# Patient Record
Sex: Male | Born: 1960 | Race: White | Hispanic: No | Smoking: Current every day smoker
Health system: Southern US, Community
[De-identification: ages and names within clinical notes are randomized; demographics above are authoritative.]

## PROBLEM LIST (undated history)

## (undated) DIAGNOSIS — I10 Essential (primary) hypertension: Secondary | ICD-10-CM

---

## 2016-04-21 ENCOUNTER — Encounter (HOSPITAL_COMMUNITY): Payer: Self-pay | Admitting: Emergency Medicine

## 2016-04-21 ENCOUNTER — Emergency Department (HOSPITAL_COMMUNITY): Payer: Managed Care, Other (non HMO)

## 2016-04-21 ENCOUNTER — Emergency Department (HOSPITAL_COMMUNITY)
Admission: EM | Admit: 2016-04-21 | Discharge: 2016-04-21 | Disposition: A | Discharge status: Home / Self Care | Payer: Managed Care, Other (non HMO) | Attending: Emergency Medicine | Admitting: Emergency Medicine

## 2016-04-21 DIAGNOSIS — Z79899 Other long term (current) drug therapy: Secondary | ICD-10-CM | POA: Diagnosis not present

## 2016-04-21 DIAGNOSIS — R05 Cough: Secondary | ICD-10-CM | POA: Diagnosis present

## 2016-04-21 DIAGNOSIS — F17200 Nicotine dependence, unspecified, uncomplicated: Secondary | ICD-10-CM | POA: Insufficient documentation

## 2016-04-21 DIAGNOSIS — J40 Bronchitis, not specified as acute or chronic: Secondary | ICD-10-CM | POA: Insufficient documentation

## 2016-04-21 LAB — CBC WITH DIFFERENTIAL/PLATELET
BASOS ABS: 0.1 10*3/uL (ref 0.0–0.1)
BASOS PCT: 1 %
EOS ABS: 0.2 10*3/uL (ref 0.0–0.7)
EOS PCT: 2 %
HCT: 46.4 % (ref 39.0–52.0)
Hemoglobin: 16.2 g/dL (ref 13.0–17.0)
Lymphocytes Relative: 30 %
Lymphs Abs: 3 10*3/uL (ref 0.7–4.0)
MCH: 31.6 pg (ref 26.0–34.0)
MCHC: 34.9 g/dL (ref 30.0–36.0)
MCV: 90.4 fL (ref 78.0–100.0)
MONO ABS: 0.6 10*3/uL (ref 0.1–1.0)
Monocytes Relative: 6 %
NEUTROS ABS: 6.2 10*3/uL (ref 1.7–7.7)
Neutrophils Relative %: 61 %
PLATELETS: 250 10*3/uL (ref 150–400)
RBC: 5.13 MIL/uL (ref 4.22–5.81)
RDW: 12.2 % (ref 11.5–15.5)
WBC: 9.9 10*3/uL (ref 4.0–10.5)

## 2016-04-21 LAB — BASIC METABOLIC PANEL
Anion gap: 10 (ref 5–15)
BUN: 8 mg/dL (ref 6–20)
CALCIUM: 8.7 mg/dL — AB (ref 8.9–10.3)
CO2: 25 mmol/L (ref 22–32)
Chloride: 100 mmol/L — ABNORMAL LOW (ref 101–111)
Creatinine, Ser: 0.87 mg/dL (ref 0.61–1.24)
Glucose, Bld: 129 mg/dL — ABNORMAL HIGH (ref 65–99)
Potassium: 4 mmol/L (ref 3.5–5.1)
SODIUM: 135 mmol/L (ref 135–145)

## 2016-04-21 MED ORDER — IPRATROPIUM-ALBUTEROL 0.5-2.5 (3) MG/3ML IN SOLN
3.0000 mL | Freq: Once | RESPIRATORY_TRACT | Status: AC
  Administered 2016-04-21: 3 mL via RESPIRATORY_TRACT
  Filled 2016-04-21: qty 3

## 2016-04-21 MED ORDER — PREDNISONE 10 MG (21) PO TBPK: ORAL_TABLET | ORAL | 0 refills | Status: AC

## 2016-04-21 MED ORDER — ALBUTEROL SULFATE HFA 108 (90 BASE) MCG/ACT IN AERS
1.0000 | INHALATION_SPRAY | RESPIRATORY_TRACT | Status: DC | PRN
  Administered 2016-04-21: 1 via RESPIRATORY_TRACT
  Filled 2016-04-21: qty 6.7

## 2016-04-21 NOTE — ED Notes (Signed)
Bed: WA06 Expected date: 04/21/16 Expected time: 8:48 AM Means of arrival: Ambulance Comments: 56 yo M wheezing, SHOB

## 2016-04-21 NOTE — Discharge Instructions (Signed)
Follow-up with your primary care doctor next week to make sure the wheezing has resolved.  As we discussed, consider seeing an ENT doctor if you continue to have hoarseness.

## 2016-04-21 NOTE — ED Triage Notes (Signed)
Patient here from home with complaints of SOB upon awakening this am. Reports cough and nasal congestion for 2 weeks. Albuterol and solu medrol 125mg  given. Denies fever. No home meds.

## 2016-04-21 NOTE — ED Provider Notes (Signed)
[facility]-EMERGENCY DEPT Provider Note   CSN: [id number] Arrival date & time: 04/21/16  0857     History   Chief Complaint Chief Complaint  Patient presents with  . Cough  . Shortness of Breath    HPI Christopher Woodard is a 56 y.o. male.  HPI Pt has had a cough off and on for several months.  Pt is a smoker.   He had some nasal congestion. Pt was treating it with OTC meds.  This morning when he woke up though he felt more short of breath.  His throat feels dry.  He could barely catch his breath.  He called EMS.  He was treated with breathing treatments and steroids and feels much bretter.     History reviewed. No pertinent past medical history.  There are no active problems to display for this patient.   History reviewed. No pertinent surgical history.     Home Medications    Prior to Admission medications   Medication Sig Start Date End Date Taking? Authorizing Provider  ibuprofen (ADVIL,MOTRIN) 200 MG tablet Take 600 mg by mouth every 6 (six) hours as needed for fever or moderate pain.   Yes Historical Provider, MD  predniSONE (STERAPRED UNI-PAK 21 TAB) 10 MG (21) TBPK tablet Take 6 tabs by mouth daily  for 2 days, then 5 tabs for 2 days, then 4 tabs for 2 days, then 3 tabs for 2 days, 2 tabs for 2 days, then 1 tab by mouth daily for 2 days 04/21/16   Linwood Dibbles, MD    Family History No family history on file.  Social History Social History  Substance Use Topics  . Smoking status: Current Every Day Smoker  . Smokeless tobacco: Never Used  . Alcohol use Yes     Allergies   Patient has no allergy information on record.   Review of Systems Review of Systems  All other systems reviewed and are negative.    Physical Exam Updated Vital Signs BP 146/77   Pulse 88   Temp 98.2 F (36.8 C) (Oral)   Resp 13   SpO2 97%   Physical Exam  Constitutional: He appears well-developed and well-nourished. No distress.  HENT:  Head: Normocephalic and atraumatic.  Right  Ear: External ear normal.  Left Ear: External ear normal.  Hoarseness, no stridor  Eyes: Conjunctivae are normal. Right eye exhibits no discharge. Left eye exhibits no discharge. No scleral icterus.  Neck: Neck supple. No tracheal deviation present.  Cardiovascular: Normal rate, regular rhythm and intact distal pulses.   Pulmonary/Chest: Effort normal. No stridor. No respiratory distress. He has wheezes. He has no rales.  Abdominal: Soft. Bowel sounds are normal. He exhibits no distension. There is no tenderness. There is no rebound and no guarding.  Musculoskeletal: He exhibits no edema or tenderness.  Neurological: He is alert. He has normal strength. No cranial nerve deficit (no facial droop, extraocular movements intact, no slurred speech) or sensory deficit. He exhibits normal muscle tone. He displays no seizure activity. Coordination normal.  Skin: Skin is warm and dry. No rash noted.  Psychiatric: He has a normal mood and affect.  Nursing note and vitals reviewed.    ED Treatments / Results  Labs (all labs ordered are listed, but only abnormal results are displayed) Labs Reviewed  BASIC METABOLIC PANEL - Abnormal; Notable for the following:       Result Value   Chloride 100 (*)    Glucose, Bld 129 (*)  Calcium 8.7 (*)    All other components within normal limits  CBC WITH DIFFERENTIAL/PLATELET    EKG  EKG Interpretation  Date/Time:  Saturday April 21 2016 09:03:37 EST Ventricular Rate:  93 PR Interval:    QRS Duration: 96 QT Interval:  352 QTC Calculation: 438 R Axis:   84 Text Interpretation:  Sinus rhythm Minimal ST depression, inferior leads No old tracing to compare Confirmed by Joshiah Traynham  MD-J, Audrea Bolte [phone]([id number]) on 04/21/2016 9:10:33 AM       Radiology Dg Chest 2 View  Result Date: 04/21/2016 CLINICAL DATA:  Shortness of breath. EXAM: CHEST  2 VIEW COMPARISON:  None. FINDINGS: The heart size is borderline. The hila and mediastinum are normal. No nodules or masses.  Mild interstitial prominence without overt edema. No focal infiltrate identified. IMPRESSION: No acute abnormalities identified. Electronically Signed   By: Gerome Samavid  Branan III M.D   On: 04/21/2016 09:41    Procedures Procedures (including critical care time)  Medications Ordered in ED Medications  albuterol (PROVENTIL HFA;VENTOLIN HFA) 108 (90 Base) MCG/ACT inhaler 1-2 puff (not administered)  ipratropium-albuterol (DUONEB) 0.5-2.5 (3) MG/3ML nebulizer solution 3 mL (3 mLs Nebulization Given 04/21/16 1016)     Initial Impression / Assessment and Plan / ED Course  I have reviewed the triage vital signs and the nursing notes.  Pertinent labs & imaging results that were available during my care of the patient were reviewed by me and considered in my medical decision making (see chart for details).   the patient's symptoms improved with treatment in the emergency room. He was breathing more easily. Chest x-ray does not show pneumonia.  I suspect his symptoms are related to bronchospasm associated with his smoking. Patient however does have a laryngitis component. He is not having stridor but there sounds like there is some inflammation of the vocal cords. Because of his smoking history stressed the importance of making sure this resolves. If his symptoms persist after the steroid treatment he should see an ENT doctor. Patient does not live in this area so he will follow up with his primary doctor when he returns home.  Final Clinical Impressions(s) / ED Diagnoses   Final diagnoses:  Bronchitis    New Prescriptions New Prescriptions   PREDNISONE (STERAPRED UNI-PAK 21 TAB) 10 MG (21) TBPK TABLET    Take 6 tabs by mouth daily  for 2 days, then 5 tabs for 2 days, then 4 tabs for 2 days, then 3 tabs for 2 days, 2 tabs for 2 days, then 1 tab by mouth daily for 2 days     Linwood DibblesJon Shyvonne Chastang, MD 04/21/16 1159

## 2016-05-18 ENCOUNTER — Encounter (HOSPITAL_COMMUNITY): Payer: Self-pay | Admitting: Emergency Medicine

## 2016-05-18 ENCOUNTER — Emergency Department (HOSPITAL_COMMUNITY)
Admission: EM | Admit: 2016-05-18 | Discharge: 2016-05-19 | Disposition: A | Discharge status: Home / Self Care | Payer: Managed Care, Other (non HMO) | Attending: Emergency Medicine | Admitting: Emergency Medicine

## 2016-05-18 ENCOUNTER — Emergency Department (HOSPITAL_COMMUNITY): Payer: Managed Care, Other (non HMO)

## 2016-05-18 DIAGNOSIS — R221 Localized swelling, mass and lump, neck: Secondary | ICD-10-CM | POA: Insufficient documentation

## 2016-05-18 DIAGNOSIS — R0602 Shortness of breath: Secondary | ICD-10-CM | POA: Diagnosis present

## 2016-05-18 DIAGNOSIS — J387 Other diseases of larynx: Secondary | ICD-10-CM

## 2016-05-18 HISTORY — DX: Essential (primary) hypertension: I10

## 2016-05-18 MED ORDER — SODIUM CHLORIDE 3 % IN NEBU
4.0000 mL | INHALATION_SOLUTION | Freq: Once | RESPIRATORY_TRACT | Status: AC
  Administered 2016-05-19: 4 mL via RESPIRATORY_TRACT
  Filled 2016-05-18: qty 4

## 2016-05-18 MED ORDER — ALBUTEROL SULFATE HFA 108 (90 BASE) MCG/ACT IN AERS
6.0000 | INHALATION_SPRAY | Freq: Once | RESPIRATORY_TRACT | Status: AC
  Administered 2016-05-18: 6 via RESPIRATORY_TRACT
  Filled 2016-05-18: qty 6.7

## 2016-05-18 MED ORDER — AEROCHAMBER PLUS W/MASK MISC
1.0000 | Freq: Once | Status: AC
  Administered 2016-05-19: 1
  Filled 2016-05-18: qty 1

## 2016-05-18 NOTE — ED Provider Notes (Signed)
[facility]-EMERGENCY DEPT Provider Note   CSN: [id number] Arrival date & time: 05/18/16  2309     History   Chief Complaint Chief Complaint  Patient presents with  . Respiratory Distress    HPI Christopher Woodard is a 56 y.o. male.  56 yo M with a chief complaint of shortness of breath. This been going on for the past 3 weeks. Patient says he has trouble getting air in. Feels that his breathing in from a pinched straw. Denies fevers or chills. Has some ongoing coughing. Was giving a breathing treatment en route with EMS with some improvement. Has seen his family physician for the same started him on multiple COPD medications. Patient feels that he is not gotten any better. Denies any lower extremity edema. Denies prior history of PE or DVT.   The history is provided by the patient and a relative.  Illness  This is a new problem. The current episode started more than 1 week ago. The problem occurs constantly. The problem has not changed since onset.Associated symptoms include shortness of breath. Pertinent negatives include no chest pain, no abdominal pain and no headaches. Nothing aggravates the symptoms. Nothing relieves the symptoms. He has tried nothing for the symptoms. The treatment provided no relief.    Past Medical History:  Diagnosis Date  . Hypertension     There are no active problems to display for this patient.   History reviewed. No pertinent surgical history.     Home Medications    Prior to Admission medications   Medication Sig Start Date End Date Taking? Authorizing Provider  albuterol (PROVENTIL HFA;VENTOLIN HFA) 108 (90 Base) MCG/ACT inhaler Inhale 1-2 puffs into the lungs every 6 (six) hours as needed for wheezing or shortness of breath.   Yes Historical Provider, MD  amLODipine (NORVASC) 5 MG tablet Take 5 mg by mouth daily.   Yes Historical Provider, MD  cetirizine (ZYRTEC) 10 MG tablet Take 10 mg by mouth daily.   Yes Historical Provider, MD  fluticasone  (FLONASE) 50 MCG/ACT nasal spray Place 1 spray into both nostrils daily.   Yes Historical Provider, MD  ibuprofen (ADVIL,MOTRIN) 200 MG tablet Take 600 mg by mouth every 6 (six) hours as needed for moderate pain.   Yes Historical Provider, MD  montelukast (SINGULAIR) 10 MG tablet Take 10 mg by mouth daily.   Yes Historical Provider, MD  umeclidinium-vilanterol (ANORO ELLIPTA) 62.5-25 MCG/INH AEPB Inhale 1 puff into the lungs daily.   Yes Historical Provider, MD  predniSONE (STERAPRED UNI-PAK 21 TAB) 10 MG (21) TBPK tablet Take 6 tabs by mouth daily  for 2 days, then 5 tabs for 2 days, then 4 tabs for 2 days, then 3 tabs for 2 days, 2 tabs for 2 days, then 1 tab by mouth daily for 2 days Patient not taking: Reported on 05/19/2016 04/21/16   Linwood Dibbles, MD    Family History No family history on file.  Social History Social History  Substance Use Topics  . Smoking status: Current Every Day Smoker  . Smokeless tobacco: Never Used  . Alcohol use Yes     Allergies   Patient has no known allergies.   Review of Systems Review of Systems  Constitutional: Negative for chills and fever.  HENT: Positive for congestion. Negative for facial swelling.   Eyes: Negative for discharge and visual disturbance.  Respiratory: Positive for cough and shortness of breath.   Cardiovascular: Negative for chest pain and palpitations.  Gastrointestinal: Negative for abdominal pain,  diarrhea and vomiting.  Musculoskeletal: Negative for arthralgias and myalgias.  Skin: Negative for color change and rash.  Neurological: Negative for tremors, syncope and headaches.  Psychiatric/Behavioral: Negative for confusion and dysphoric mood.     Physical Exam Updated Vital Signs BP 181/94 Comment: MD aware of BP and states pt is ok for dc  Pulse 89 Comment: MD aware of BP and states pt is ok for dc  Temp 98.3 F (36.8 C) (Oral)   Resp 20   Ht 6' (1.829 m)   Wt 202 lb (91.6 kg)   SpO2 94% Comment: MD aware of BP and  states pt is ok for dc  BMI 27.40 kg/m   Physical Exam  Constitutional: He is oriented to person, place, and time. He appears well-developed and well-nourished.  HENT:  Head: Normocephalic and atraumatic.  Inspiratory stridor. Tolerating his secretions. No noted posterior pharyngeal swelling. Posterior nasal drip is noted. No fullness to the submandibular space. Uvula is midline.  Bilateral bulging TMs with purulent effusions.  No sinus tenderness to percussion.  Eyes: EOM are normal. Pupils are equal, round, and reactive to light.  Neck: Normal range of motion. Neck supple. No JVD present.  Cardiovascular: Normal rate and regular rhythm.  Exam reveals no gallop and no friction rub.   No murmur heard. Pulmonary/Chest: No respiratory distress. He has no wheezes.  Abdominal: He exhibits no distension and no mass. There is no tenderness. There is no rebound and no guarding.  Musculoskeletal: Normal range of motion.  Neurological: He is alert and oriented to person, place, and time.  Skin: No rash noted. No pallor.  Psychiatric: He has a normal mood and affect. His behavior is normal.  Nursing note and vitals reviewed.    ED Treatments / Results  Labs (all labs ordered are listed, but only abnormal results are displayed) Labs Reviewed  CBC WITH DIFFERENTIAL/PLATELET - Abnormal; Notable for the following:       Result Value   MCHC 36.9 (*)    All other components within normal limits  BASIC METABOLIC PANEL - Abnormal; Notable for the following:    Sodium 129 (*)    Potassium 3.2 (*)    Chloride 93 (*)    Glucose, Bld 150 (*)    BUN <5 (*)    All other components within normal limits  I-STAT CHEM 8, ED - Abnormal; Notable for the following:    Sodium 130 (*)    Potassium 3.3 (*)    Chloride 91 (*)    BUN 3 (*)    Glucose, Bld 149 (*)    Calcium, Ion 1.10 (*)    All other components within normal limits    EKG  EKG Interpretation None       Radiology Dg Chest 2  View  Result Date: 05/19/2016 CLINICAL DATA:  Chronic worsening shortness of breath. Initial encounter. EXAM: CHEST  2 VIEW COMPARISON:  Chest radiograph performed 04/21/2016 FINDINGS: The lungs are well-aerated and clear. There is no evidence of focal opacification, pleural effusion or pneumothorax. The heart is normal in size; the mediastinal contour is within normal limits. No acute osseous abnormalities are seen. IMPRESSION: No acute cardiopulmonary process seen. Electronically Signed   By: Roanna Raider M.D.   On: 05/19/2016 00:09   Ct Soft Tissue Neck W Contrast  Result Date: 05/19/2016 CLINICAL DATA:  Laryngeal mass seen on chest CT EXAM: CT NECK WITH CONTRAST TECHNIQUE: Multidetector CT imaging of the neck was performed using the standard  protocol following the bolus administration of intravenous contrast. CONTRAST:  75mL ISOVUE-300 IOPAMIDOL (ISOVUE-300) INJECTION 61% COMPARISON:  Chest CT 05/19/2016 FINDINGS: Pharynx and larynx: The nasopharynx and oropharynx are normal. There is a fluid density focus at the right superior margin of the epiglottis. The parapharyngeal spaces are preserved. The visible oral cavity is normal. There is no retropharyngeal effusion, abscess or lymphadenopathy. There is asymmetric hyperdensity in thickening of the left aryepiglottic fold. There is a nodular lesion of the left vocal cord that measures approximately 1.3 x 1.4 cm (series 3, image 82). Abnormal density extends to the anterior commissure. There is no erosion of the overlying thyroid cartilage. Salivary glands: The parotid and submandibular glands are normal. No sialolithiasis or salivary ductal dilatation. Thyroid: Normal. Lymph nodes: Level IIa lymph nodes measure up to 9 mm on the right and 7 mm on the left. There are no enlarged or abnormal density lower cervical lymph nodes. Vascular: The major cervical vessels are normal. Limited intracranial: Normal. Visualized orbits: Normal. Mastoids and visualized  paranasal sinuses: Minimal lower maxillary mucosal thickening. Mastoids are clear. Skeleton: There is no bony spinal canal stenosis. No lytic or blastic lesions. Upper chest: Clear. Other: None. IMPRESSION: 1. Nodular mass of the left vocal fold extending to the anterior commissure and superiorly to the left aryepiglottic fold. No extension into or through the thyroid cartilage. 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 Recommend correlation with direct visualization and histologic sampling. 2. Bilateral level IIa lymph nodes measure up to 9 mm. No abnormal density or enlarged lymph nodes by CT size criteria. 3. Cystic focus at the left vallecula, favored to be a pharyngeal mucosal retention cyst. Electronically Signed   By: Deatra Robinson M.D.   On: 05/19/2016 03:05   Ct Angio Chest Pe W And/or Wo Contrast  Result Date: 05/19/2016 CLINICAL DATA:  Shortness of breath.  Stridor.  Smoking history. EXAM: CT ANGIOGRAPHY CHEST WITH CONTRAST TECHNIQUE: Multidetector CT imaging of the chest was performed using the standard protocol during bolus administration of intravenous contrast. Multiplanar CT image reconstructions and MIPs were obtained to evaluate the vascular anatomy. CONTRAST:  100 cc Isovue 370 IV COMPARISON:  Radiographs 1 hour prior. FINDINGS: Cardiovascular: There are no filling defects within the pulmonary arteries to suggest pulmonary embolus. Dilatation of the main pulmonary artery, 3.5 cm, suggesting pulmonary arterial hypertension. Moderate aortic atherosclerosis without aneurysm. The heart is normal in size. There are coronary artery calcifications. Mediastinum/Nodes: Abnormal nodular soft tissue density in the left larynx at the level of the cricoid cartilage with marked narrowing of the airway. The more trachea distally is patent. Small hilar and mediastinal nodes. No evidence of supraclavicular adenopathy. No axillary adenopathy. No pericardial effusion. The esophagus is decompressed.  Lungs/Pleura: Moderate emphysema. There is peripheral subpleural reticulation, greatest in the right lower lobe. 3 mm nodule in the right upper lobe image 38 series 7. Trace mucus in the left lower lobe bronchus. No confluent airspace disease to suggest pneumonia. No pleural fluid. No evidence of pulmonary edema. Upper Abdomen: No acute abnormality.  No adrenal nodule. Musculoskeletal: No blastic or destructive lytic lesions. Mild degenerative change in the thoracic spine. Review of the MIP images confirms the above findings. IMPRESSION: 1. No pulmonary embolus. 2. Findings suspicious for laryngeal mass causing airway narrowing. Recommend neck CT with contrast. 3. Emphysema. Subpleural reticulation within both lungs may be interstitial lung disease or secondary to emphysema. 4. Right upper lobe pulmonary nodule measures 3 mm. 5. Enlarged pulmonary artery suggesting  pulmonary arterial hypertension. Thoracic aortic atherosclerosis. These results were called by telephone at the time of interpretation on 05/19/2016 at 1:23 am to Dr. Donnald GarrePfeiffer, who verbally acknowledged these results. Electronically Signed   By: Rubye OaksMelanie  Ehinger M.D.   On: 05/19/2016 01:24    Procedures Procedures (including critical care time)  Medications Ordered in ED Medications  sodium chloride HYPERTONIC 3 % nebulizer solution 4 mL (4 mLs Nebulization Given 05/19/16 0009)  albuterol (PROVENTIL HFA;VENTOLIN HFA) 108 (90 Base) MCG/ACT inhaler 6 puff (6 puffs Inhalation Given 05/18/16 2355)  aerochamber plus with mask device 1 each (1 each Other Given 05/19/16 0009)  iopamidol (ISOVUE-370) 76 % injection 100 mL (100 mLs Intravenous Contrast Given 05/19/16 0029)  0.9 %  sodium chloride infusion (0 mLs Intravenous Stopped 05/19/16 0254)  iopamidol (ISOVUE-300) 61 % injection 75 mL (75 mLs Intravenous Contrast Given 05/19/16 0223)     Initial Impression / Assessment and Plan / ED Course  I have reviewed the triage vital signs and the nursing  notes.  Pertinent labs & imaging results that were available during my care of the patient were reviewed by me and considered in my medical decision making (see chart for details).     56 yo M With a chief complaint of shortness of breath. Patient has inspiratory stridor on my exam. Clinically appears that he had a URI. He tells me that he has this funny noise and then is able to cough up some phlegm and it improved symptoms. I will give him a hypertonic saline neb.  CXR, labs, reassess.   CT to eval for possible cancer with airway/laryngeal n involvement with his hx of smoking.  Turned over to Dr. Broadus JohnPfieffer.   The patients results and plan were reviewed and discussed.   Any x-rays performed were independently reviewed by myself.   Differential diagnosis were considered with the presenting HPI.  Medications  sodium chloride HYPERTONIC 3 % nebulizer solution 4 mL (4 mLs Nebulization Given 05/19/16 0009)  albuterol (PROVENTIL HFA;VENTOLIN HFA) 108 (90 Base) MCG/ACT inhaler 6 puff (6 puffs Inhalation Given 05/18/16 2355)  aerochamber plus with mask device 1 each (1 each Other Given 05/19/16 0009)  iopamidol (ISOVUE-370) 76 % injection 100 mL (100 mLs Intravenous Contrast Given 05/19/16 0029)  0.9 %  sodium chloride infusion (0 mLs Intravenous Stopped 05/19/16 0254)  iopamidol (ISOVUE-300) 61 % injection 75 mL (75 mLs Intravenous Contrast Given 05/19/16 0223)    Vitals:   05/19/16 0220 05/19/16 0324 05/19/16 0330 05/19/16 0457  BP: 200/89 147/82 163/89 181/94  Pulse: 78 89 78 89  Resp: 18 21 20    Temp:      TempSrc:      SpO2: 92% 95% 93% 94%  Weight:      Height:        Final diagnoses:  Shortness of breath  Laryngeal mass     Final Clinical Impressions(s) / ED Diagnoses   Final diagnoses:  Shortness of breath  Laryngeal mass    New Prescriptions Discharge Medication List as of 05/19/2016  4:54 AM       Melene Planan Wetona Viramontes, DO 05/19/16 2300

## 2016-05-18 NOTE — ED Triage Notes (Signed)
Per EMS pt was seen 3 weeks ago and diagnosed with bronchitis. Lungs are diminished in all areas, wheezing present as well, and voice hoarseness. Pt has received 10 of albuterol, 0.5 of atrovent, and 125 of solumedrol. Pt also recently diagnosed with HTN.

## 2016-05-18 NOTE — ED Notes (Signed)
Patient transported to X-ray 

## 2016-05-18 NOTE — ED Notes (Signed)
Bed: WA17 Expected date:  Expected time:  Means of arrival:  Comments: 56 yo M  resp distress

## 2016-05-19 ENCOUNTER — Emergency Department (HOSPITAL_COMMUNITY): Payer: Managed Care, Other (non HMO)

## 2016-05-19 ENCOUNTER — Encounter (HOSPITAL_COMMUNITY): Payer: Self-pay | Admitting: Radiology

## 2016-05-19 LAB — CBC WITH DIFFERENTIAL/PLATELET
BASOS PCT: 1 %
Basophils Absolute: 0.1 10*3/uL (ref 0.0–0.1)
Eosinophils Absolute: 0.2 10*3/uL (ref 0.0–0.7)
Eosinophils Relative: 3 %
HEMATOCRIT: 42.3 % (ref 39.0–52.0)
HEMOGLOBIN: 15.6 g/dL (ref 13.0–17.0)
LYMPHS ABS: 3.1 10*3/uL (ref 0.7–4.0)
Lymphocytes Relative: 43 %
MCH: 32.8 pg (ref 26.0–34.0)
MCHC: 36.9 g/dL — AB (ref 30.0–36.0)
MCV: 88.9 fL (ref 78.0–100.0)
MONOS PCT: 7 %
Monocytes Absolute: 0.5 10*3/uL (ref 0.1–1.0)
NEUTROS ABS: 3.3 10*3/uL (ref 1.7–7.7)
NEUTROS PCT: 46 %
Platelets: 249 10*3/uL (ref 150–400)
RBC: 4.76 MIL/uL (ref 4.22–5.81)
RDW: 12.2 % (ref 11.5–15.5)
WBC: 7.2 10*3/uL (ref 4.0–10.5)

## 2016-05-19 LAB — I-STAT CHEM 8, ED
BUN: 3 mg/dL — AB (ref 6–20)
CHLORIDE: 91 mmol/L — AB (ref 101–111)
Calcium, Ion: 1.1 mmol/L — ABNORMAL LOW (ref 1.15–1.40)
Creatinine, Ser: 0.7 mg/dL (ref 0.61–1.24)
Glucose, Bld: 149 mg/dL — ABNORMAL HIGH (ref 65–99)
HEMATOCRIT: 47 % (ref 39.0–52.0)
Hemoglobin: 16 g/dL (ref 13.0–17.0)
Potassium: 3.3 mmol/L — ABNORMAL LOW (ref 3.5–5.1)
SODIUM: 130 mmol/L — AB (ref 135–145)
TCO2: 26 mmol/L (ref 0–100)

## 2016-05-19 LAB — BASIC METABOLIC PANEL
Anion gap: 11 (ref 5–15)
BUN: <5 mg/dL — ABNORMAL LOW (ref 6–20)
CHLORIDE: 93 mmol/L — AB (ref 101–111)
CO2: 25 mmol/L (ref 22–32)
CREATININE: 0.76 mg/dL (ref 0.61–1.24)
Calcium: 8.9 mg/dL (ref 8.9–10.3)
GFR calc non Af Amer: >60 mL/min (ref >60)
Glucose, Bld: 150 mg/dL — ABNORMAL HIGH (ref 65–99)
POTASSIUM: 3.2 mmol/L — AB (ref 3.5–5.1)
SODIUM: 129 mmol/L — AB (ref 135–145)

## 2016-05-19 MED ORDER — SODIUM CHLORIDE 0.9 % IV SOLN
1000.0000 mL | INTRAVENOUS | Status: DC
  Administered 2016-05-19: 1000 mL via INTRAVENOUS

## 2016-05-19 MED ORDER — IOPAMIDOL (ISOVUE-300) INJECTION 61%
75.0000 mL | Freq: Once | INTRAVENOUS | Status: AC | PRN
  Administered 2016-05-19: 75 mL via INTRAVENOUS

## 2016-05-19 MED ORDER — IOPAMIDOL (ISOVUE-370) INJECTION 76%
INTRAVENOUS | Status: AC
  Filled 2016-05-19: qty 100

## 2016-05-19 MED ORDER — SODIUM CHLORIDE 0.9 % IV SOLN
1000.0000 mL | Freq: Once | INTRAVENOUS | Status: AC
  Administered 2016-05-19: 1000 mL via INTRAVENOUS

## 2016-05-19 MED ORDER — IOPAMIDOL (ISOVUE-370) INJECTION 76%
100.0000 mL | Freq: Once | INTRAVENOUS | Status: AC | PRN
  Administered 2016-05-19: 100 mL via INTRAVENOUS

## 2016-05-19 NOTE — ED Provider Notes (Signed)
Patient CT scan has returned with laryngeal mass 1.4 x 1.3 cm. 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 Patient is alert and appropriate. He does have a hoarse voice but airway is intact without respiratory distress. I did review the patient's case with Dr. Suszanne Connerseoh who is able to see the patient at the beginning of the week. The patient reports he is however from out of town approximately 3 hours away and plans to contact his family doctor on Monday. Patient is aware of the urgency around being seen by ENT for biopsy and definitive management. He is aware of signs and symptoms for which return.   Arby BarretteMarcy Kassie Keng, MD 05/19/16 [phone]

## 2016-10-06 ENCOUNTER — Emergency Department (HOSPITAL_COMMUNITY)
Admission: EM | Admit: 2016-10-06 | Discharge: 2016-10-07 | Disposition: A | Discharge status: Home / Self Care | Payer: PRIVATE HEALTH INSURANCE | Attending: Emergency Medicine | Admitting: Emergency Medicine

## 2016-10-06 ENCOUNTER — Encounter (HOSPITAL_COMMUNITY): Payer: Self-pay | Admitting: Emergency Medicine

## 2016-10-06 DIAGNOSIS — Z79899 Other long term (current) drug therapy: Secondary | ICD-10-CM | POA: Diagnosis not present

## 2016-10-06 DIAGNOSIS — F17200 Nicotine dependence, unspecified, uncomplicated: Secondary | ICD-10-CM | POA: Insufficient documentation

## 2016-10-06 DIAGNOSIS — I10 Essential (primary) hypertension: Secondary | ICD-10-CM | POA: Insufficient documentation

## 2016-10-06 DIAGNOSIS — Z430 Encounter for attention to tracheostomy: Secondary | ICD-10-CM | POA: Diagnosis present

## 2016-10-06 NOTE — ED Provider Notes (Signed)
[facility]-EMERGENCY DEPT Provider Note   CSN: [id number] Arrival date & time: 10/06/16  2340  By signing my name below, I, Christopher Woodard, attest that this documentation has been prepared under the direction and in the presence of physician practitioner, Melene Plan, DO. Electronically Signed: Linna Woodard, Scribe. 10/06/2016. 11:59 PM.  History   Chief Complaint Chief Complaint  Patient presents with  . Plug in trach   The history is provided by the patient. No language interpreter was used.  Shortness of Breath  This is a new problem. The average episode lasts 2 hours. The problem occurs continuously.The current episode started 1 to 2 hours ago. The problem has not changed since onset.Pertinent negatives include no chest pain and no vomiting. Precipitated by: something stuck in tracheostomy plug. He has tried inhaled steroids (given in ED) for the symptoms. The treatment provided mild relief.    HPI Comments: Christopher Woodard is a 56 y.o. male who presents to the Emergency Department via EMS for evaluation of a problem with his tracheostomy plug beginning shortly prior to arrival. He feels like something is stuck in the plug and endorses some secondary dyspnea. Patient had a cough productive of a large amount of mucus yesterday but has not had any similar episodes since. He has had worn a tracheostomy plug since March of this year. Patient denies chest pain, nausea, vomiting, or any other associated symptoms.  Past Medical History:  Diagnosis Date  . Hypertension     There are no active problems to display for this patient.   History reviewed. No pertinent surgical history.     Home Medications    Prior to Admission medications   Medication Sig Start Date End Date Taking? Authorizing Provider  amLODipine (NORVASC) 5 MG tablet Take 5 mg by mouth daily.   Yes [provider]  predniSONE (STERAPRED UNI-PAK 21 TAB) 10 MG (21) TBPK tablet Take 6 tabs by mouth daily  for 2  days, then 5 tabs for 2 days, then 4 tabs for 2 days, then 3 tabs for 2 days, 2 tabs for 2 days, then 1 tab by mouth daily for 2 days Patient not taking: Reported on 05/19/2016 04/21/16   Christopher Dibbles, MD    Family History History reviewed. No pertinent family history.  Social History Social History  Substance Use Topics  . Smoking status: Current Every Day Smoker  . Smokeless tobacco: Never Used  . Alcohol use Yes     Allergies   Patient has no known allergies.   Review of Systems Review of Systems  Respiratory: Positive for shortness of breath.   Cardiovascular: Negative for chest pain.  Gastrointestinal: Negative for nausea and vomiting.   Physical Exam Updated Vital Signs BP 102/74 (BP Location: Left Arm)   Pulse 86   Temp 98.4 F (36.9 C) (Oral)   Resp 18   SpO2 100%   Physical Exam  Constitutional: He is oriented to person, place, and time. He appears well-developed and well-nourished.  HENT:  Head: Normocephalic and atraumatic.  Swelling underneath jaw. Tracheostomy in place. No oozing or redness around tracheostomy site.  Eyes: Pupils are equal, round, and reactive to light. EOM are normal.  Neck: Normal range of motion. Neck supple. No JVD present.  Cardiovascular: Normal rate and regular rhythm.  Exam reveals no gallop and no friction rub.   No murmur heard. Pulmonary/Chest: He is in respiratory distress.  Abdominal: He exhibits no distension. There is no rebound and no guarding.  Musculoskeletal: Normal  range of motion.  Neurological: He is alert and oriented to person, place, and time.  Skin: No rash noted. He is diaphoretic. No pallor.  Psychiatric: He has a normal mood and affect. His behavior is normal.  Nursing note and vitals reviewed.  ED Treatments / Results  Labs (all labs ordered are listed, but only abnormal results are displayed) Labs Reviewed  CBC WITH DIFFERENTIAL/PLATELET - Abnormal; Notable for the following:       Result Value   WBC 11.1  (*)    RBC 3.57 (*)    Hemoglobin 11.9 (*)    HCT 33.9 (*)    Neutro Abs 9.4 (*)    All other components within normal limits  BASIC METABOLIC PANEL - Abnormal; Notable for the following:    Sodium 131 (*)    Chloride 95 (*)    Glucose, Bld 159 (*)    All other components within normal limits    EKG  EKG Interpretation None       Radiology Dg Chest Port 1 View  Result Date: 10/07/2016 CLINICAL DATA:  Shortness of breath EXAM: PORTABLE CHEST 1 VIEW COMPARISON:  None. FINDINGS: There is a tracheostomy tube with tip above the level of the clavicles. The lungs are clear. Cardiomediastinal contours are normal. No pleural effusion or pneumothorax. IMPRESSION: Clear lungs. Tracheostomy tube tip above the level of the clavicles. Recommend direct visualization to confirm appropriate positioning. Electronically Signed   By: Deatra Robinson M.D.   On: 10/07/2016 00:21    Procedures Procedures (including critical care time)  COORDINATION OF CARE: 11:52 PM Discussed treatment plan with pt at bedside and pt agreed to plan.  Medications Ordered in ED Medications  albuterol (PROVENTIL) (2.5 MG/3ML) 0.083% nebulizer solution 5 mg ( Nebulization Not Given 10/07/16 0018)  albuterol (PROVENTIL) (2.5 MG/3ML) 0.083% nebulizer solution 5 mg (5 mg Nebulization Given 10/07/16 0149)     Initial Impression / Assessment and Plan / ED Course  I have reviewed the triage vital signs and the nursing notes.  Pertinent labs & imaging results that were available during my care of the patient were reviewed by me and considered in my medical decision making (see chart for details).     56 yo M With a chief complaint of something caught in his trach. Eventually he was able to cough this up with assistance from suctioning. He had multiple events where he is able to cough some things up. He is feeling much better. Discharge home with PCP follow-up.  4:42 AM:  I have discussed the diagnosis/risks/treatment  options with the patient and believe the pt to be eligible for discharge home to follow-up with PCP. We also discussed returning to the ED immediately if new or worsening sx occur. We discussed the sx which are most concerning (e.g., sudden worsening pain, fever, inability to tolerate by mouth) that necessitate immediate return. Medications administered to the patient during their visit and any new prescriptions provided to the patient are listed below.  Medications given during this visit Medications  albuterol (PROVENTIL) (2.5 MG/3ML) 0.083% nebulizer solution 5 mg ( Nebulization Not Given 10/07/16 0018)  albuterol (PROVENTIL) (2.5 MG/3ML) 0.083% nebulizer solution 5 mg (5 mg Nebulization Given 10/07/16 0149)     The patient appears reasonably screen and/or stabilized for discharge and I doubt any other medical condition or other [facility] requiring further screening, evaluation, or treatment in the ED at this time prior to discharge.    Final Clinical Impressions(s) / ED Diagnoses  Final diagnoses:  Tracheostomy, acute management [facility](HCC)    New Prescriptions Discharge Medication List as of 10/07/2016  3:08 AM      I personally performed the services described in this documentation, which was scribed in my presence. The recorded information has been reviewed and is accurate.    Melene PlanFloyd, Dacari Beckstrand, DO 10/07/16 [phone]

## 2016-10-06 NOTE — ED Triage Notes (Signed)
Patient called ems to his home. Patient think he has a plug in his trach. Patient has a 6 Shiley. Patient states that he can not get air. EMS has a non rebreather over his trach.

## 2016-10-06 NOTE — ED Notes (Signed)
Bed: [id number] Expected date:  Expected time:  Means of arrival:  Comments: 4511m trach mucus plug

## 2016-10-07 ENCOUNTER — Emergency Department (HOSPITAL_COMMUNITY): Payer: PRIVATE HEALTH INSURANCE

## 2016-10-07 LAB — CBC WITH DIFFERENTIAL/PLATELET
Basophils Absolute: 0 10*3/uL (ref 0.0–0.1)
Basophils Relative: 0 %
EOS ABS: 0.1 10*3/uL (ref 0.0–0.7)
EOS PCT: 1 %
HCT: 33.9 % — ABNORMAL LOW (ref 39.0–52.0)
Hemoglobin: 11.9 g/dL — ABNORMAL LOW (ref 13.0–17.0)
LYMPHS ABS: 0.8 10*3/uL (ref 0.7–4.0)
LYMPHS PCT: 7 %
MCH: 33.3 pg (ref 26.0–34.0)
MCHC: 35.1 g/dL (ref 30.0–36.0)
MCV: 95 fL (ref 78.0–100.0)
Monocytes Absolute: 0.8 10*3/uL (ref 0.1–1.0)
Monocytes Relative: 7 %
Neutro Abs: 9.4 10*3/uL — ABNORMAL HIGH (ref 1.7–7.7)
Neutrophils Relative %: 85 %
PLATELETS: 314 10*3/uL (ref 150–400)
RBC: 3.57 MIL/uL — ABNORMAL LOW (ref 4.22–5.81)
RDW: 15.2 % (ref 11.5–15.5)
WBC: 11.1 10*3/uL — ABNORMAL HIGH (ref 4.0–10.5)

## 2016-10-07 LAB — BASIC METABOLIC PANEL
Anion gap: 10 (ref 5–15)
BUN: 8 mg/dL (ref 6–20)
CO2: 26 mmol/L (ref 22–32)
CREATININE: 0.74 mg/dL (ref 0.61–1.24)
Calcium: 9 mg/dL (ref 8.9–10.3)
Chloride: 95 mmol/L — ABNORMAL LOW (ref 101–111)
GFR calc Af Amer: >60 mL/min (ref >60)
GLUCOSE: 159 mg/dL — AB (ref 65–99)
Potassium: 4.1 mmol/L (ref 3.5–5.1)
SODIUM: 131 mmol/L — AB (ref 135–145)

## 2016-10-07 MED ORDER — ALBUTEROL SULFATE (2.5 MG/3ML) 0.083% IN NEBU
5.0000 mg | INHALATION_SOLUTION | Freq: Once | RESPIRATORY_TRACT | Status: AC
  Administered 2016-10-07: 5 mg via RESPIRATORY_TRACT
  Filled 2016-10-07: qty 6

## 2016-10-07 MED ORDER — ALBUTEROL SULFATE (2.5 MG/3ML) 0.083% IN NEBU
INHALATION_SOLUTION | RESPIRATORY_TRACT | Status: AC
  Filled 2016-10-07: qty 3

## 2016-10-07 NOTE — ED Notes (Signed)
Patient calling daughter to come get him.

## 2016-10-07 NOTE — Progress Notes (Signed)
Pt. was sx.'d thru #6 cuffless trach with no inner cannula present which was was removed prior to my arrival due to possible mucus plug with only small amount tan/blood tinged sputum obtained then s/u on ATC@40 %/8L Oxygen initially, 5 mg Albuterol aerosol given with moderate amount thick/tan blood tinged/plug-like tenascious mucus spontaniously coughed up after nebulizer tx. given, Trach care done/cleaned with new inner cannula placed along with sterile dressing and new trach ties, pt. remains on humidified aerosol trach collar weaned down to 28%/6 lpm, b/l b.s. cleared after neb and sx.'ing, RT to monitor.

## 2017-06-19 ENCOUNTER — Ambulatory Visit: Payer: Self-pay | Admitting: Adult Health

## 2018-04-14 IMAGING — CR DG CHEST 2V
2 series · 2 of 2 positions shown · non-contrast
Comparison: Chest radiograph performed 04/21/2016

CLINICAL DATA: Chronic worsening shortness of breath. Initial
encounter.

EXAM:
CHEST  2 VIEW

[w chest pa]
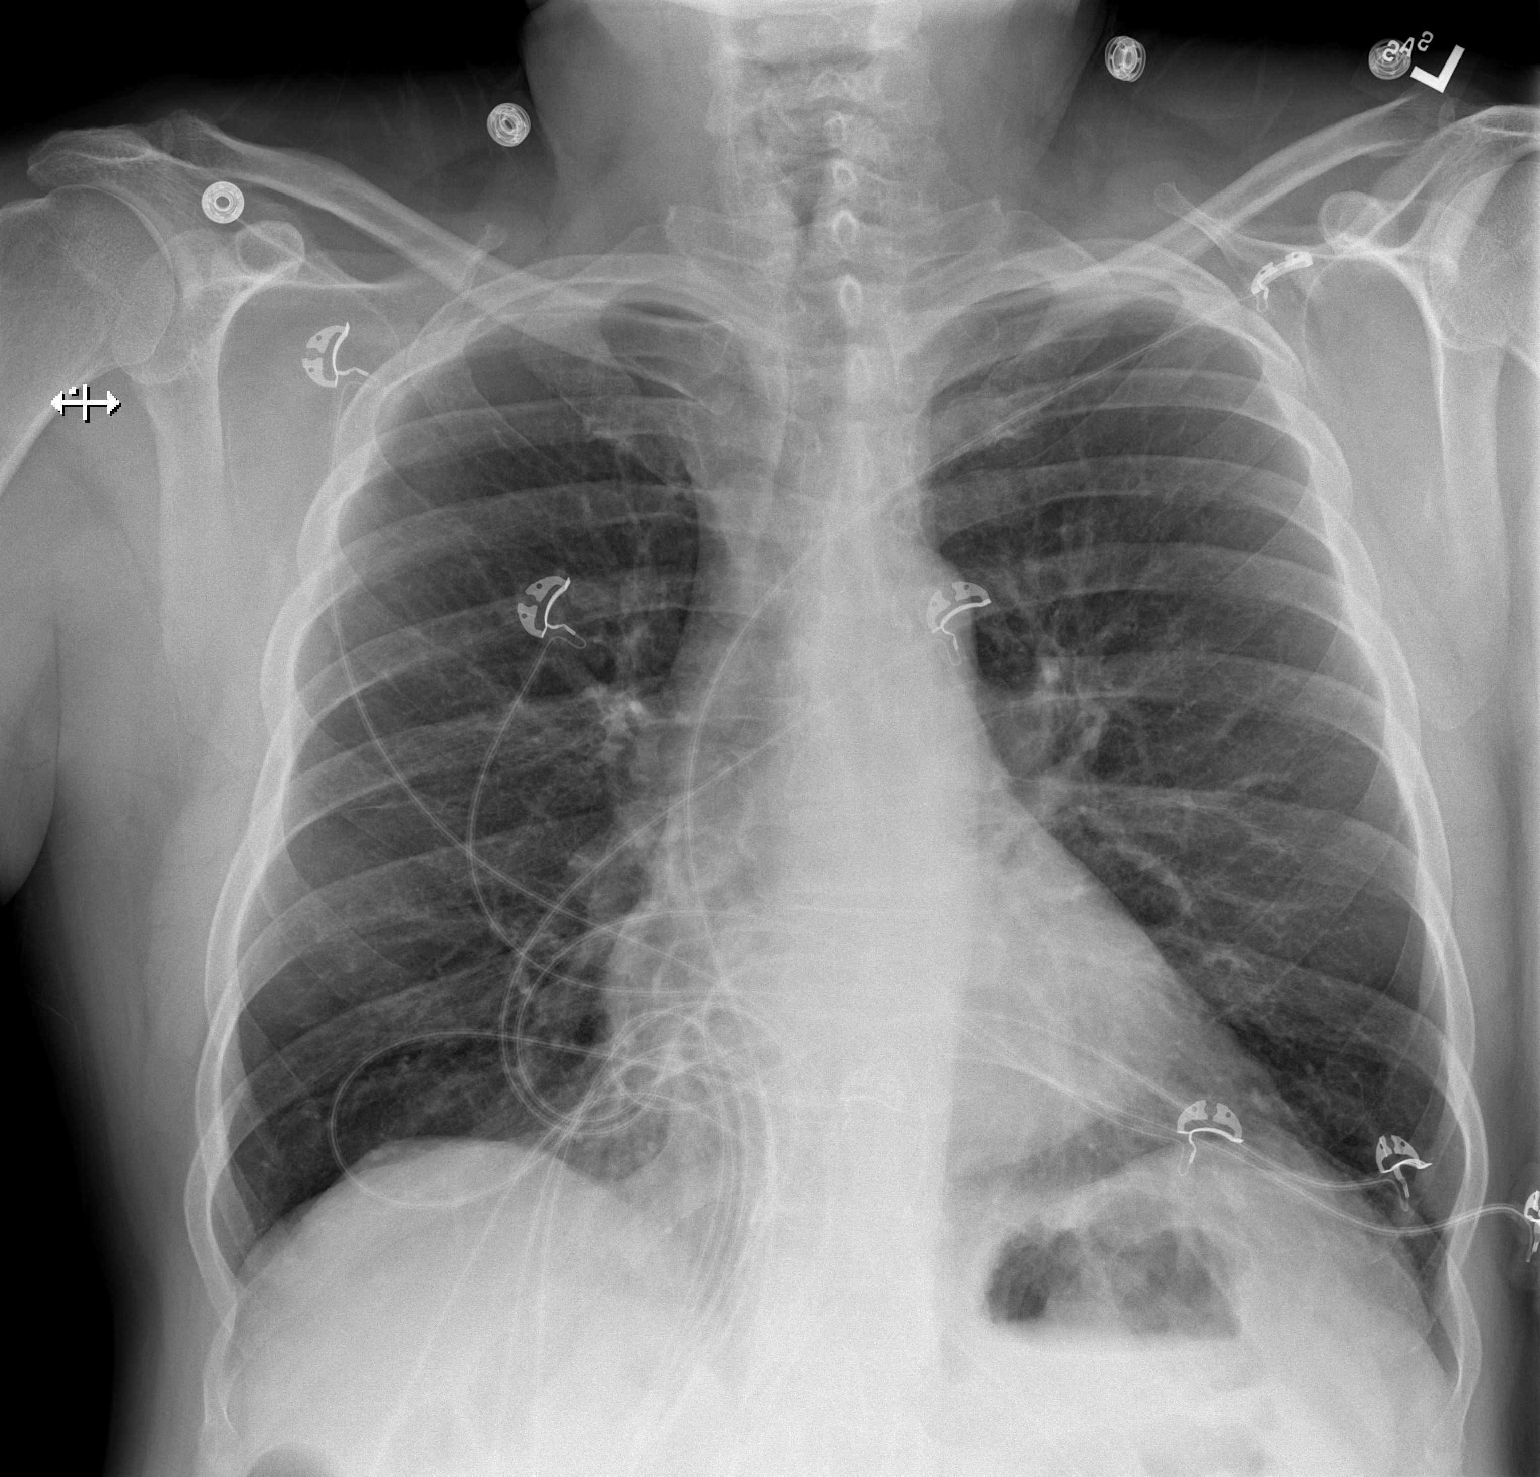

[w chest lat]
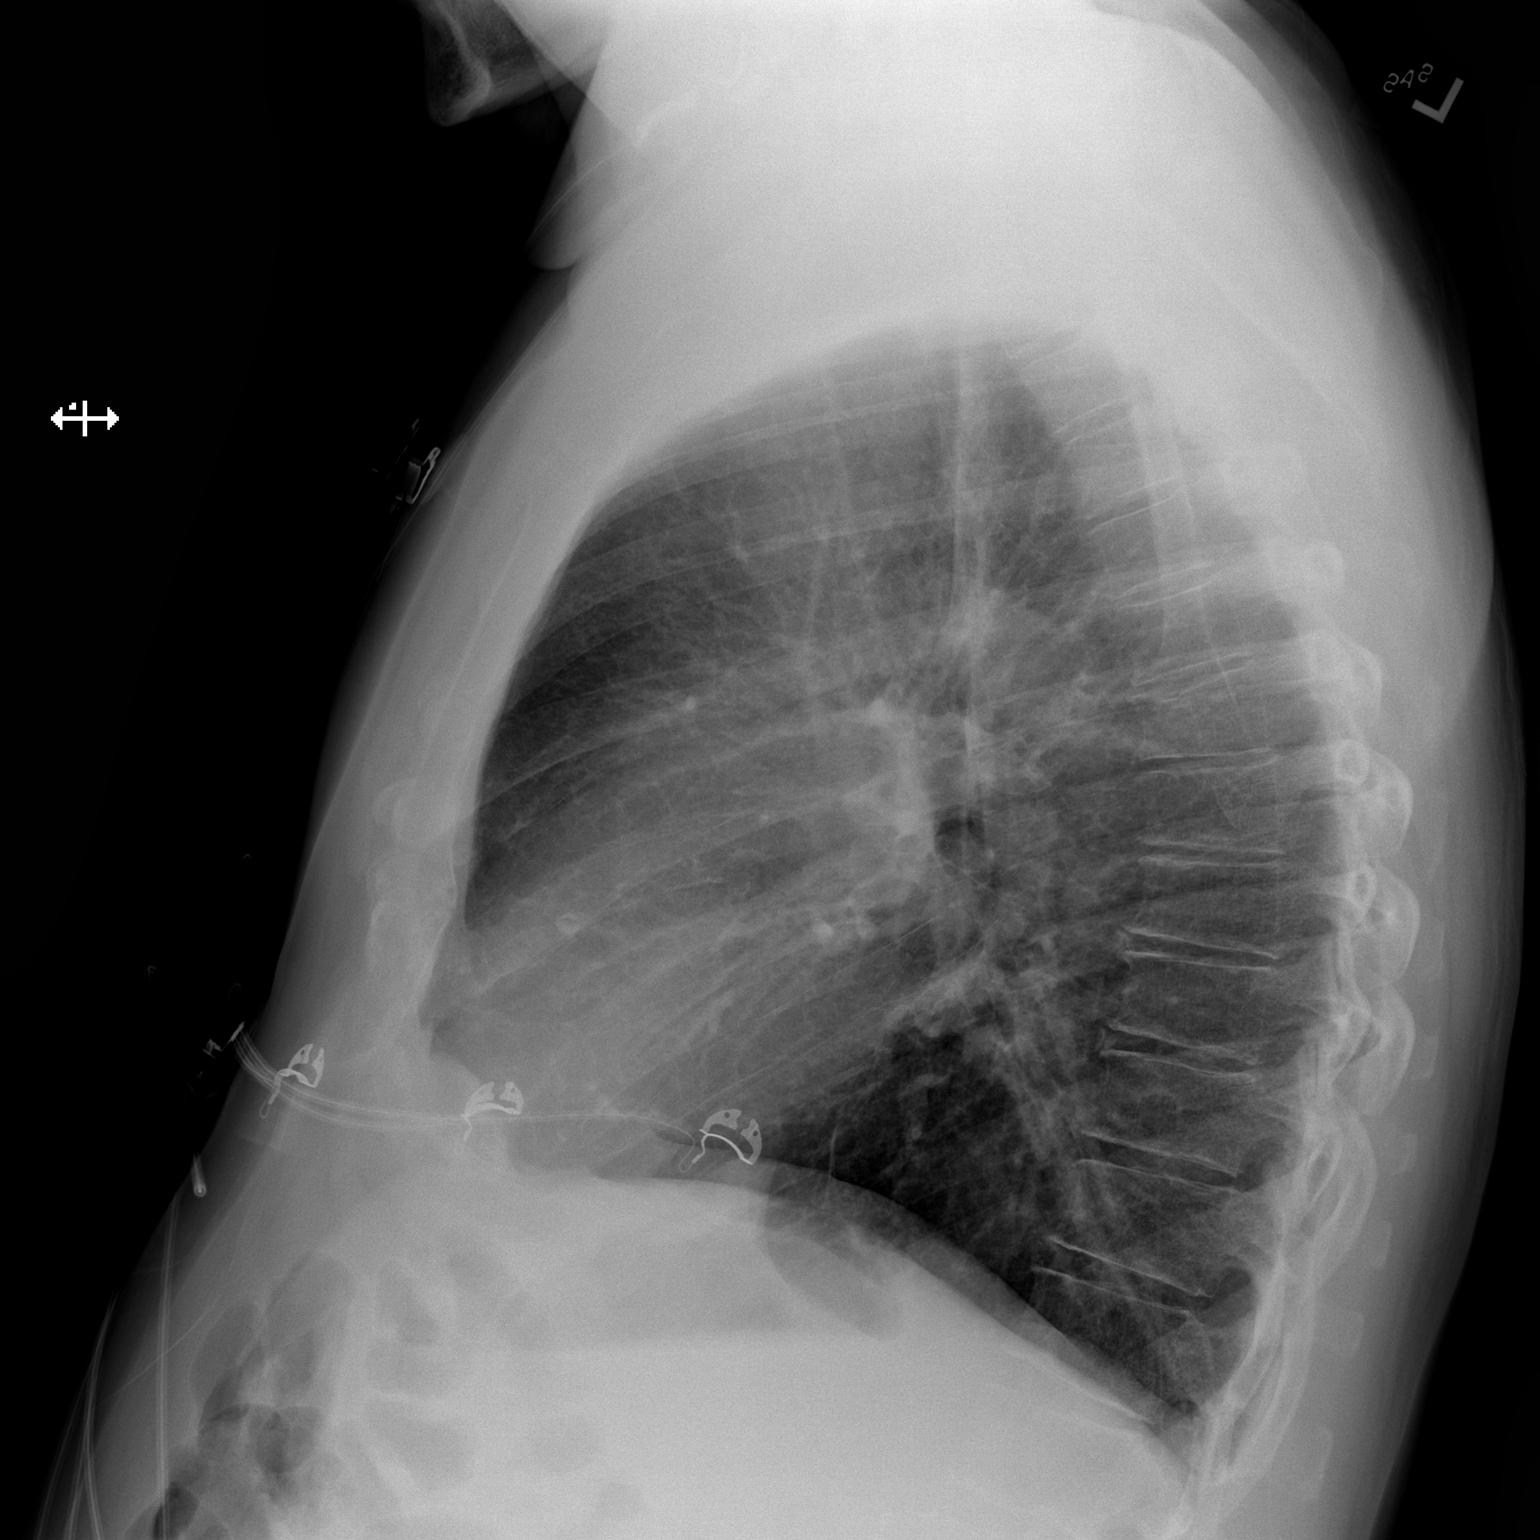

[2 of 2 positions shown; findings below may reference images not displayed]

FINDINGS: The lungs are well-aerated and clear. There is no evidence of focal
opacification, pleural effusion or pneumothorax.

The heart is normal in size; the mediastinal contour is within
normal limits. No acute osseous abnormalities are seen.
IMPRESSION: No acute cardiopulmonary process seen.

## 2018-04-15 IMAGING — CT CT NECK W/ CM
4 of 5 series · 15 of 33 positions shown, 17 images · IV contrast (iopamidol)
Comparison: Chest CT 05/19/2016

CLINICAL DATA: Laryngeal mass seen on chest CT

EXAM:
CT NECK WITH CONTRAST
TECHNIQUE: Multidetector CT imaging of the neck was performed using the
standard protocol following the bolus administration of intravenous
contrast.
CONTRAST:  75mL GUDRTO-TZZ IOPAMIDOL (GUDRTO-TZZ) INJECTION 61%

[Series 3: neck with st · axial · 0.41mm/px · z∈[-274,-124]mm · 4 of 126 slices shown, 5 images]
[im 26/126  soft-tissue]
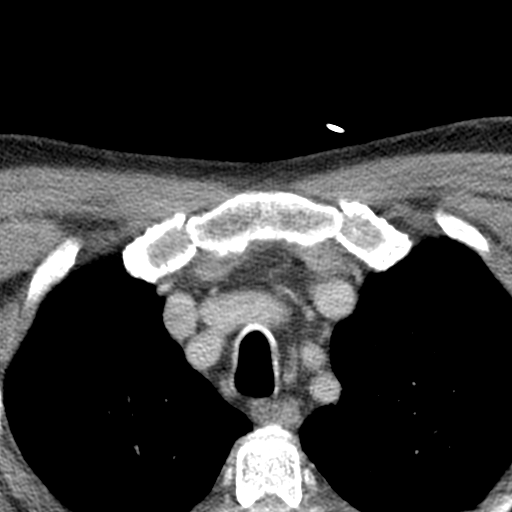
[im 26/126  bone]
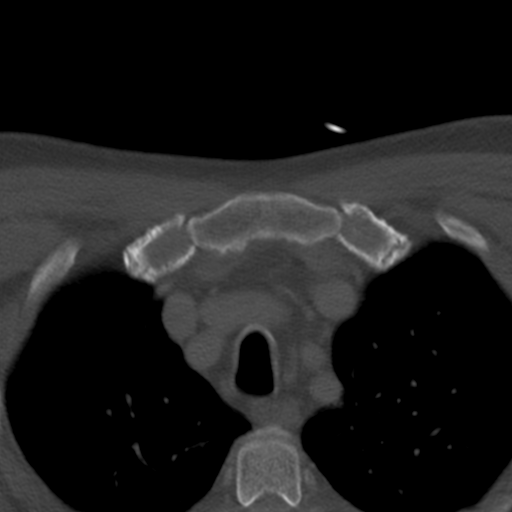
[im 51/126  bone]
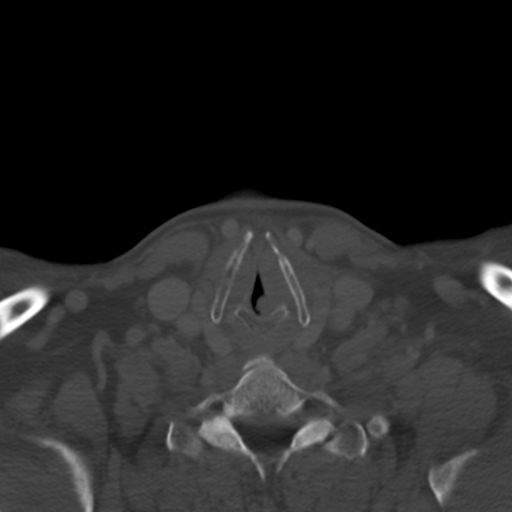
[im 76/126  bone]
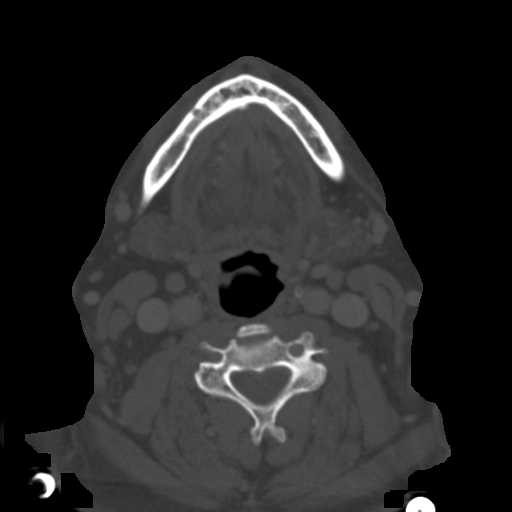
[im 101/126  bone]
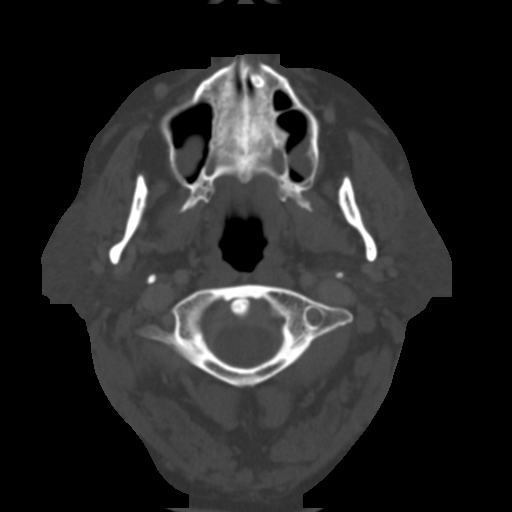

[Series 7: coronal st · coronal · 0.54mm/px · 3 of 104 slices shown]
[im 30/104  bone]
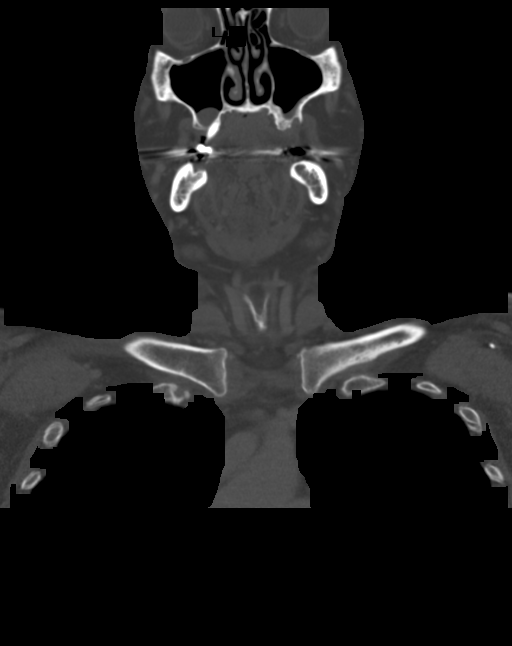
[im 43/104  bone]
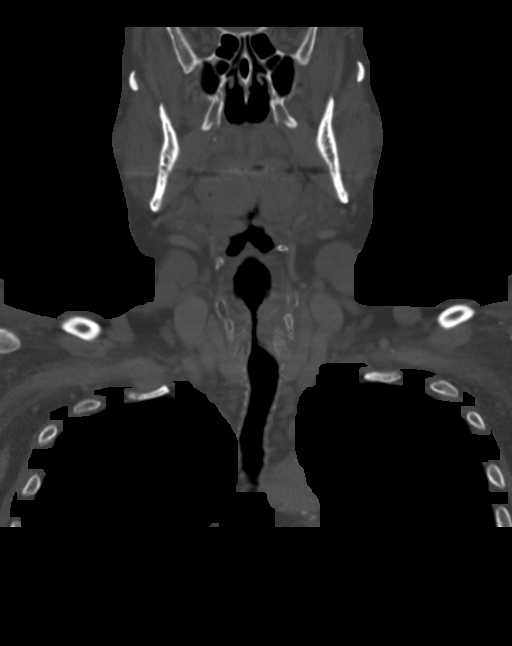
[im 57/104  bone]
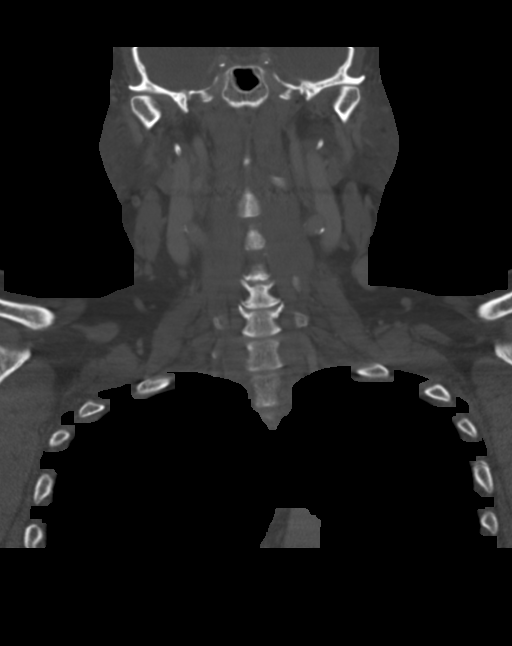

[Series 8: sagittal st · sagittal · 0.51mm/px · 5 of 97 slices shown, 6 images]
[im 33/97  bone]
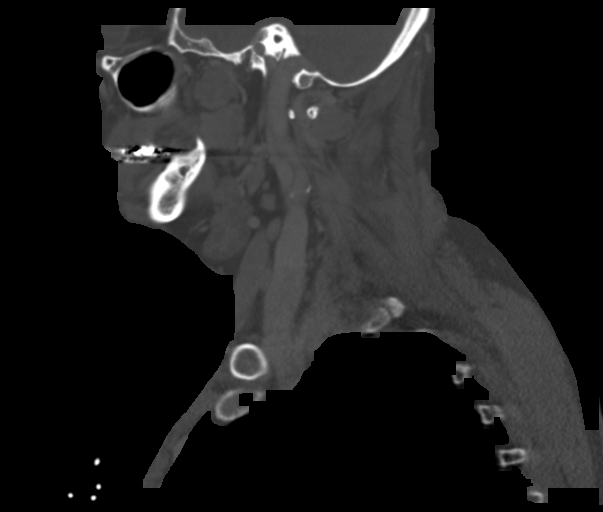
[im 41/97  bone]
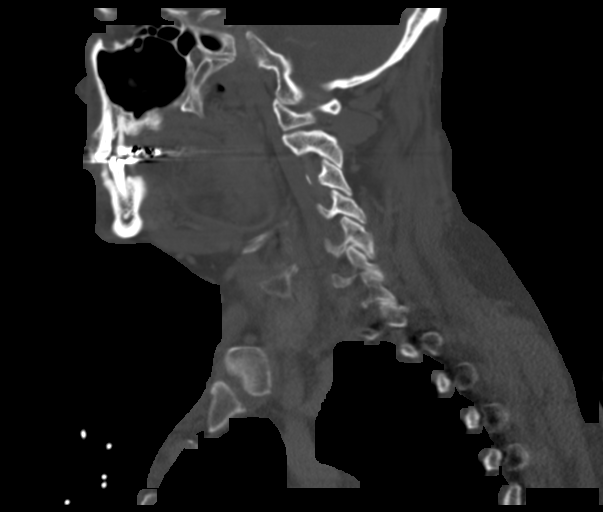
[im 49/97  soft-tissue]
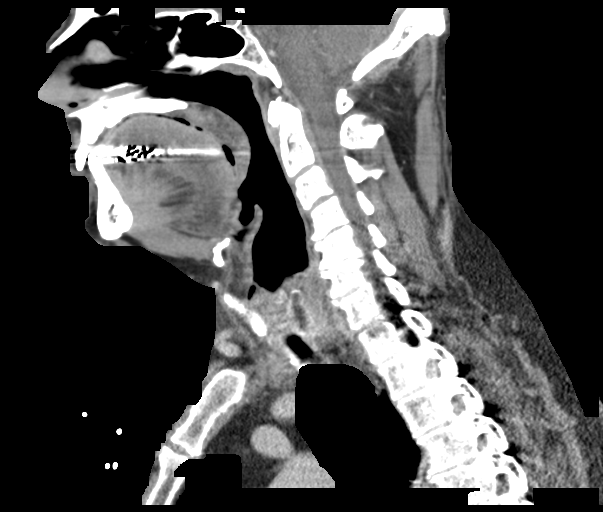
[im 49/97  bone]
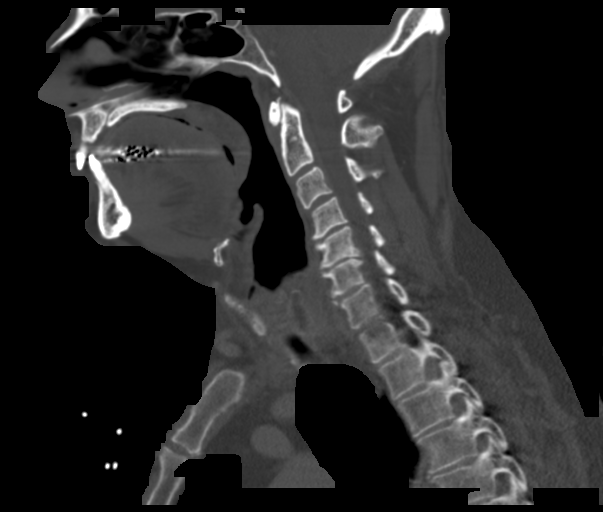
[im 57/97  bone]
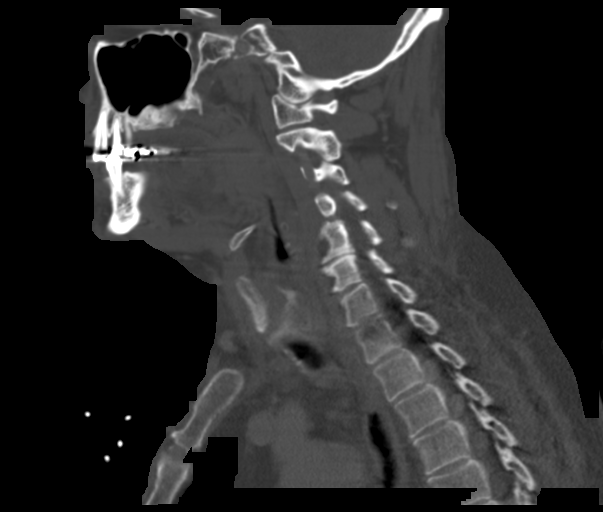
[im 65/97  bone]
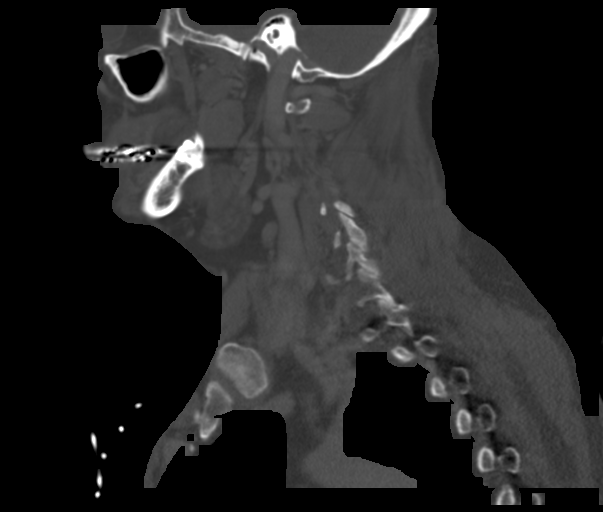

[Series 9: ax axial recons · axial · 0.39mm/px · z∈[-318,-214]mm · 3 of 140 slices shown]
[im 28/140  bone]
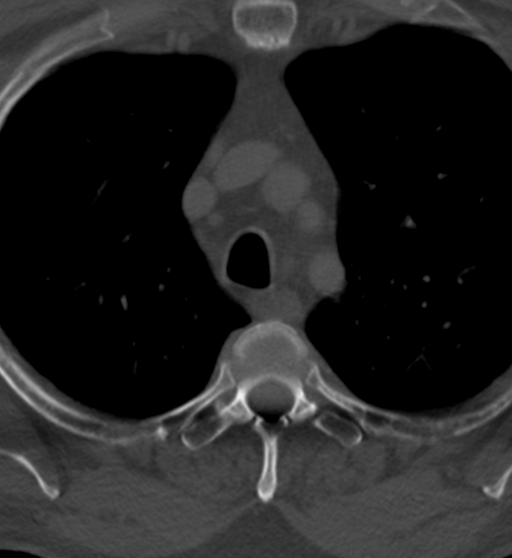
[im 56/140  bone]
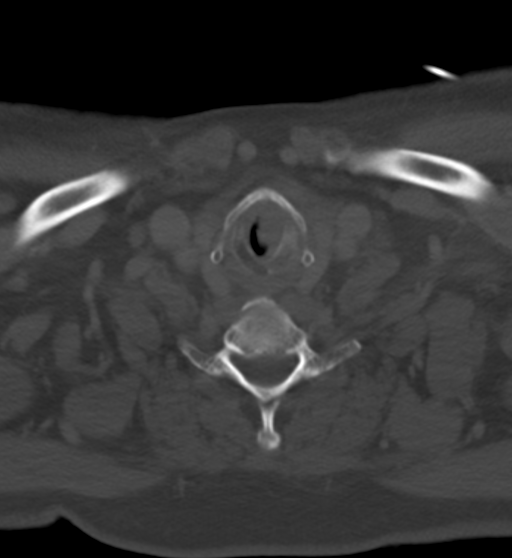
[im 84/140  bone]
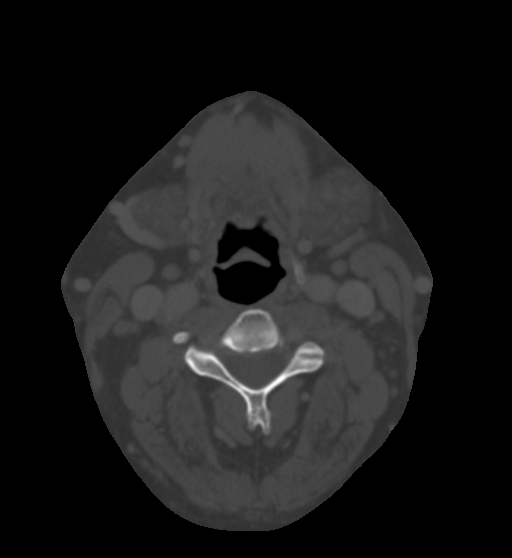

[15 of 33 positions shown; findings below may reference images not displayed]

FINDINGS: Pharynx and larynx: The nasopharynx and oropharynx are normal. There
is a fluid density focus at the right superior margin of the
epiglottis. The parapharyngeal spaces are preserved. The visible
oral cavity is normal. There is no retropharyngeal effusion, abscess
or lymphadenopathy.

There is asymmetric hyperdensity in thickening of the left
aryepiglottic fold. There is a nodular lesion of the left vocal cord
that measures approximately 1.3 x 1.4 cm (series 3, image 82).
Abnormal density extends to the anterior commissure. There is no
erosion of the overlying thyroid cartilage.

Salivary glands: The parotid and submandibular glands are normal. No
sialolithiasis or salivary ductal dilatation.

Thyroid: Normal.

Lymph nodes: Level IIa lymph nodes measure up to 9 mm on the right
and 7 mm on the left. There are no enlarged or abnormal density
lower cervical lymph nodes.

Vascular: The major cervical vessels are normal.

Limited intracranial: Normal.

Visualized orbits: Normal.

Mastoids and visualized paranasal sinuses: Minimal lower maxillary
mucosal thickening. Mastoids are clear.

Skeleton: There is no bony spinal canal stenosis. No lytic or
blastic lesions.

Upper chest: Clear.

Other: None.
IMPRESSION: 1. Nodular mass of the left vocal fold extending to the anterior
commissure and superiorly to the left aryepiglottic fold. No
extension into or through the thyroid cartilage. The appearance is
concerning for a laryngeal malignancy. Recommend correlation with
direct visualization and histologic sampling.
2. Bilateral level IIa lymph nodes measure up to 9 mm. No abnormal
density or enlarged lymph nodes by CT size criteria.
3. Cystic focus at the left vallecula, favored to be a pharyngeal
mucosal retention cyst.

## 2019-06-18 DEATH — deceased
